# Patient Record
Sex: Female | Born: 1965 | Race: White | Hispanic: No | Marital: Single | Smoking: Current every day smoker
Health system: Southern US, Community
[De-identification: ages and names within clinical notes are randomized; demographics above are authoritative.]

## PROBLEM LIST (undated history)

## (undated) HISTORY — PX: TUBAL LIGATION: SHX77

---

## 2006-01-24 ENCOUNTER — Emergency Department (HOSPITAL_COMMUNITY): Admission: EM | Admit: 2006-01-24 | Discharge: 2006-01-24 | Payer: Self-pay | Admitting: Emergency Medicine

## 2009-04-17 ENCOUNTER — Encounter: Admission: RE | Admit: 2009-04-17 | Discharge: 2009-04-17 | Payer: Self-pay | Admitting: Neurosurgery

## 2009-05-05 ENCOUNTER — Encounter: Admission: RE | Admit: 2009-05-05 | Discharge: 2009-08-03 | Payer: Self-pay | Admitting: Neurosurgery

## 2010-01-23 ENCOUNTER — Encounter: Admission: RE | Admit: 2010-01-23 | Discharge: 2010-01-23 | Payer: Self-pay | Admitting: Neurosurgery

## 2013-09-12 ENCOUNTER — Emergency Department (HOSPITAL_COMMUNITY)
Admission: EM | Admit: 2013-09-12 | Discharge: 2013-09-12 | Disposition: A | Discharge status: Home / Self Care | Payer: Self-pay | Attending: Emergency Medicine | Admitting: Emergency Medicine

## 2013-09-12 ENCOUNTER — Encounter (HOSPITAL_COMMUNITY): Payer: Self-pay | Admitting: Emergency Medicine

## 2013-09-12 DIAGNOSIS — G5630 Lesion of radial nerve, unspecified upper limb: Secondary | ICD-10-CM | POA: Insufficient documentation

## 2013-09-12 DIAGNOSIS — F17200 Nicotine dependence, unspecified, uncomplicated: Secondary | ICD-10-CM | POA: Insufficient documentation

## 2013-09-12 LAB — BASIC METABOLIC PANEL
BUN: 7 mg/dL (ref 6–23)
CO2: 28 meq/L (ref 19–32)
CREATININE: 0.77 mg/dL (ref 0.50–1.10)
Calcium: 9.4 mg/dL (ref 8.4–10.5)
Chloride: 100 mEq/L (ref 96–112)
GFR calc Af Amer: >90 mL/min (ref >90)
GFR calc non Af Amer: >90 mL/min (ref >90)
GLUCOSE: 77 mg/dL (ref 70–99)
POTASSIUM: 4 meq/L (ref 3.7–5.3)
Sodium: 138 mEq/L (ref 137–147)

## 2013-09-12 LAB — CBC WITH DIFFERENTIAL/PLATELET
BASOS ABS: 0 10*3/uL (ref 0.0–0.1)
Basophils Relative: 0 % (ref 0–1)
Eosinophils Absolute: 0.2 10*3/uL (ref 0.0–0.7)
Eosinophils Relative: 3 % (ref 0–5)
HCT: 43.7 % (ref 36.0–46.0)
Hemoglobin: 15.2 g/dL — ABNORMAL HIGH (ref 12.0–15.0)
Lymphocytes Relative: 28 % (ref 12–46)
Lymphs Abs: 2.5 10*3/uL (ref 0.7–4.0)
MCH: 34.2 pg — ABNORMAL HIGH (ref 26.0–34.0)
MCHC: 34.8 g/dL (ref 30.0–36.0)
MCV: 98.4 fL (ref 78.0–100.0)
Monocytes Absolute: 0.4 10*3/uL (ref 0.1–1.0)
Monocytes Relative: 5 % (ref 3–12)
NEUTROS ABS: 5.6 10*3/uL (ref 1.7–7.7)
Neutrophils Relative %: 64 % (ref 43–77)
PLATELETS: 196 10*3/uL (ref 150–400)
RBC: 4.44 MIL/uL (ref 3.87–5.11)
RDW: 12.9 % (ref 11.5–15.5)
WBC: 8.7 10*3/uL (ref 4.0–10.5)

## 2013-09-12 MED ORDER — NAPROXEN 500 MG PO TABS: 500.0000 mg | ORAL_TABLET | Freq: Two times a day (BID) | ORAL | Status: DC

## 2013-09-12 MED ORDER — IBUPROFEN 800 MG PO TABS: 800.0000 mg | ORAL_TABLET | Freq: Three times a day (TID) | ORAL | Status: DC

## 2013-09-12 NOTE — Discharge Instructions (Signed)
Radial Nerve Palsy °Wrist drop is also known as radial nerve palsy. It is a condition in which you can not extend your wrist. This means if you are standing with your elbow bent at a right angle and with the top of your hand pointed at the ceiling, you can not hold your hand up. It falls toward the floor.  °This action of extending your wrist is caused by the muscles in the back of your arm. These muscles are controlled by the radial nerve. This means that anything affecting the radial nerve so it can not tell the muscles how to work will cause wrist drop. This is medically called radial nerve palsy. Also the radial nerve is a motor and sensory nerve so anything affecting it causes problems with movement and feeling. °CAUSES  °Some more common causes of wrist drop are: °· A break (fracture) of the large bone in the arm between your shoulder and your elbow (humerus). This is because the radial nerve winds around the humerus. °· Improper use of crutches causes this because the radial nerve runs through the armpit (axilla). Crutches which are too long can put pressure on the nerve. This is sometimes called crutch palsy. °· Falling asleep with your arm over a chair and supported on the back is a common cause. This is sometimes called Saturday Night Syndrome. °· Wrist drop can be associated with lead poisoning because of the effect of lead on the radial nerve. °SYMPTOMS  °The wrist drop is an obvious problem, but there may also be numbness in the back of the arm, forearm or hand which provides feeling in these areas by the radial nerve. There can be difficulty straightening out the elbow in addition to the wrist. There may be numbness, tingling, pain, burning sensations or other abnormal feelings. Symptoms depend entirely on where the radial nerve is injured. °DIAGNOSIS  °· Wrist drop is obvious just by looking at it. Your caregiver may make the diagnosis by taking your history and doing a couple tests. °· One test which  may be done is a nerve conduction study. This test shows if the radial nerve is conducting signals well. If not, it can determine where the nerve problem is. °· Sometimes X-ray studies are done. Your caregiver will determine if further testing needs to be done. °TREATMENT  °· Usually if the problem is found to be pressure on the nerve, simply removing the pressure will allow the nerve to go back to normal in a few weeks to a few months. Other treatments will depend upon the cause found. °· Only take over-the-counter or prescription medicines for pain, discomfort, or fever as directed by your caregiver. °· Sometimes seizure medications are used. °· Steroids are sometimes given to decrease swelling if it is thought to be a possible cause. °Document Released: 01/21/2006 Document Revised: 08/09/2011 Document Reviewed: 03/03/2006 °[employer]® Patient Information ©2014 [employer]. ° °

## 2013-09-12 NOTE — ED Provider Notes (Signed)
CSN: [id number]     Arrival date & time 09/12/13  1736 History  This chart was scribed for Rolland PorterMark Trenda Corliss, MD by Danella Maiersaroline Early, ED Scribe. This patient was seen in room APA15/APA15 and the patient's care was started at 8:54 PM.    Chief Complaint  Patient presents with  . Numbness    lt arm   The history is provided by the patient. No language interpreter was used.   HPI Comments: Michaela Morgan is a 48 y.o. female who presents to the Emergency Department complaining of constant, gradually-improving left arm numbness onset 3 nights ago after falling asleep in a chair. Pt states she is still having numbness and tingling in the entire forearm and she is unable to curl the fingers in her left hand. She states the chair she slept in had rails and she had her arms resting on the rails. She states she started wearing a brace yesterday and thinks it helped the numbness a little. States she developed throbbing pain in the forearm yesterday.    History reviewed. No pertinent past medical history. History reviewed. No pertinent past surgical history. History reviewed. No pertinent family history. History  Substance Use Topics  . Smoking status: Current Every Day Smoker -- 1.00 packs/day    Types: Cigarettes  . Smokeless tobacco: Not on file  . Alcohol Use: Yes   OB History   Grav Para Term Preterm Abortions TAB SAB Ect Mult Living                 Review of Systems  Constitutional: Negative for fever, chills, diaphoresis, appetite change and fatigue.  HENT: Negative for mouth sores, sore throat and trouble swallowing.   Eyes: Negative for visual disturbance.  Respiratory: Negative for cough, chest tightness, shortness of breath and wheezing.   Cardiovascular: Negative for chest pain.  Gastrointestinal: Negative for nausea, vomiting, abdominal pain, diarrhea and abdominal distention.  Endocrine: Negative for polydipsia, polyphagia and polyuria.  Genitourinary: Negative for dysuria, frequency and  hematuria.  Musculoskeletal: Negative for gait problem.  Skin: Negative for color change, pallor and rash.  Neurological: Positive for numbness. Negative for dizziness, syncope, light-headedness and headaches.  Hematological: Does not bruise/bleed easily.  Psychiatric/Behavioral: Negative for behavioral problems and confusion.      Allergies  Review of patient's allergies indicates no known allergies.  Home Medications   Prior to Admission medications   Not on File   BP 143/74  Pulse 74  Temp(Src) 97.7 F (36.5 C) (Oral)  Resp 18  Ht 5\' 8"  (1.727 m)  Wt 138 lb (62.596 kg)  BMI 20.99 kg/m2  SpO2 100%  LMP 08/12/2013 Physical Exam  Constitutional: She is oriented to person, place, and time. She appears well-developed and well-nourished. No distress.  HENT:  Head: Normocephalic.  Eyes: Conjunctivae are normal. Pupils are equal, round, and reactive to light. No scleral icterus.  Neck: Normal range of motion. Neck supple. No thyromegaly present.  Cardiovascular: Normal rate and regular rhythm.  Exam reveals no gallop and no friction rub.   No murmur heard. Pulmonary/Chest: Effort normal and breath sounds normal. No respiratory distress. She has no wheezes. She has no rales.  Abdominal: Soft. Bowel sounds are normal. She exhibits no distension. There is no tenderness. There is no rebound.  Musculoskeletal: Normal range of motion.  Neurological: She is alert and oriented to person, place, and time.  Skin: Skin is warm and dry. No rash noted.  Psychiatric: She has a normal mood and  affect. Her behavior is normal.   exam her left upper shoulder shows normal pulses and capillary refill. She has normal ulnar and dinner function. She has radial nerve palsy. Some tenderness but no palpable hematoma or mass or obvious noted injury to slightly above the elbow. She has weakness to extension at the wrist and to all digits. Had falls into flexion.  ED Course  Procedures (including critical  care time) Medications - No data to display  DIAGNOSTIC STUDIES: Oxygen Saturation is 100% on RA, normal by my interpretation.    COORDINATION OF CARE: 9:12 PM- Discussed treatment plan with pt. Pt agrees to plan.    Labs Review Labs Reviewed  CBC WITH DIFFERENTIAL - Abnormal; Notable for the following:    Hemoglobin 15.2 (*)    MCH 34.2 (*)    All other components within normal limits  BASIC METABOLIC PANEL    Imaging Review No results found.   EKG Interpretation None      MDM   Final diagnoses:  Radial nerve palsy    Exam and history sig for radial nerve palsy. She's getting summary every symptoms. Placed in a Velcro splint told her new position. Inflammatory. Neurological followup.  I personally performed the services described in this documentation, which was scribed in my presence. The recorded information has been reviewed and is accurate.   Rolland PorterMark Kym Scannell, MD 09/12/13 2128

## 2013-09-12 NOTE — ED Notes (Signed)
Pt states she woke up with lt arm numb 2 days ago, states the hand is tingling and has no use of her lt hand.

## 2015-01-02 ENCOUNTER — Emergency Department (HOSPITAL_COMMUNITY): Payer: Self-pay

## 2015-01-02 ENCOUNTER — Encounter (HOSPITAL_COMMUNITY): Payer: Self-pay | Admitting: Emergency Medicine

## 2015-01-02 ENCOUNTER — Emergency Department (HOSPITAL_COMMUNITY)
Admission: EM | Admit: 2015-01-02 | Discharge: 2015-01-02 | Disposition: A | Discharge status: Home / Self Care | Payer: Self-pay | Attending: Emergency Medicine | Admitting: Emergency Medicine

## 2015-01-02 DIAGNOSIS — R200 Anesthesia of skin: Secondary | ICD-10-CM | POA: Insufficient documentation

## 2015-01-02 DIAGNOSIS — Z720 Tobacco use: Secondary | ICD-10-CM | POA: Insufficient documentation

## 2015-01-02 DIAGNOSIS — R0602 Shortness of breath: Secondary | ICD-10-CM | POA: Insufficient documentation

## 2015-01-02 DIAGNOSIS — M549 Dorsalgia, unspecified: Secondary | ICD-10-CM | POA: Insufficient documentation

## 2015-01-02 DIAGNOSIS — R110 Nausea: Secondary | ICD-10-CM | POA: Insufficient documentation

## 2015-01-02 DIAGNOSIS — M542 Cervicalgia: Secondary | ICD-10-CM | POA: Insufficient documentation

## 2015-01-02 DIAGNOSIS — R0789 Other chest pain: Secondary | ICD-10-CM | POA: Insufficient documentation

## 2015-01-02 DIAGNOSIS — R05 Cough: Secondary | ICD-10-CM | POA: Insufficient documentation

## 2015-01-02 LAB — BASIC METABOLIC PANEL
ANION GAP: 9 (ref 5–15)
BUN: 9 mg/dL (ref 6–20)
CALCIUM: 9 mg/dL (ref 8.9–10.3)
CO2: 23 mmol/L (ref 22–32)
Chloride: 105 mmol/L (ref 101–111)
Creatinine, Ser: 0.87 mg/dL (ref 0.44–1.00)
GFR calc Af Amer: >60 mL/min (ref >60)
GLUCOSE: 109 mg/dL — AB (ref 65–99)
Potassium: 4 mmol/L (ref 3.5–5.1)
Sodium: 137 mmol/L (ref 135–145)

## 2015-01-02 LAB — CBC
HCT: 44.3 % (ref 36.0–46.0)
Hemoglobin: 15.5 g/dL — ABNORMAL HIGH (ref 12.0–15.0)
MCH: 34.8 pg — ABNORMAL HIGH (ref 26.0–34.0)
MCHC: 35 g/dL (ref 30.0–36.0)
MCV: 99.6 fL (ref 78.0–100.0)
Platelets: 172 10*3/uL (ref 150–400)
RBC: 4.45 MIL/uL (ref 3.87–5.11)
RDW: 12.5 % (ref 11.5–15.5)
WBC: 6.7 10*3/uL (ref 4.0–10.5)

## 2015-01-02 LAB — D-DIMER, QUANTITATIVE: D-Dimer, Quant: <0.27 ug/mL-FEU (ref 0.00–0.48)

## 2015-01-02 LAB — TROPONIN I

## 2015-01-02 MED ORDER — HYDROCODONE-ACETAMINOPHEN 5-325 MG PO TABS: 1.0000 | ORAL_TABLET | Freq: Four times a day (QID) | ORAL | Status: AC | PRN

## 2015-01-02 MED ORDER — NAPROXEN 500 MG PO TABS: 500.0000 mg | ORAL_TABLET | Freq: Two times a day (BID) | ORAL | Status: AC

## 2015-01-02 NOTE — Discharge Instructions (Signed)
Chest Wall Pain °Chest wall pain is pain felt in or around the chest bones and muscles. It may take up to 6 weeks to get better. It may take longer if you are active. Chest wall pain can happen on its own. Other times, things like germs, injury, coughing, or exercise can cause the pain. °HOME CARE  °· Avoid activities that make you tired or cause pain. Try not to use your chest, belly (abdominal), or side muscles. Do not use heavy weights. °· Put ice on the sore area. °¨ Put ice in a plastic bag. °¨ Place a towel between your skin and the bag. °¨ Leave the ice on for 15-20 minutes for the first 2 days. °· Only take medicine as told by your doctor. °GET HELP RIGHT AWAY IF:  °· You have more pain or are very uncomfortable. °· You have a fever. °· Your chest pain gets worse. °· You have new problems. °· You feel sick to your stomach (nauseous) or throw up (vomit). °· You start to sweat or feel lightheaded. °· You have a cough with mucus (phlegm). °· You cough up blood. °MAKE SURE YOU:  °· Understand these instructions. °· Will watch your condition. °· Will get help right away if you are not doing well or get worse. °Document Released: 11/03/2007 Document Revised: 08/09/2011 Document Reviewed: 01/11/2011 °[employer]® Patient Information ©2015 [employer]. This information is not intended to replace advice given to you by your health care provider. Make sure you discuss any questions you have with your health care provider. ° °

## 2015-01-02 NOTE — ED Provider Notes (Signed)
CSN: [id number]     Arrival date & time 01/02/15  0829 History  This chart was scribed for Vanetta Mulders, MD by Andrew Au, ED Scribe. This patient was seen in room APA14/APA14 and the patient's care was started at 8:44 AM.   Chief Complaint  Patient presents with  . Chest Pain   The history is provided by the patient. No language interpreter was used.   HPI Comments:  Michaela Morgan is a 49 y.o. female who present to the Emergency Department complaining of gradually worsening, radiating, 10/10 constant left sternum chest pain for the last month. She states chest pain would initially radiate to left back but within last couple weeks pain radiates to left shoulder and up to left neck.  Reports worsening chest pain with certain movement but finds relief with positioning her left arm against her chest but has occasional numbness to left arm and left hand when doing this. She's noticed associated slight SOB and nausea, no emesis.She has a cough due to sinus. She denies treating pain and associated symptoms.  She denies fever, emesis, visual changes, rhinorrhea, diarrhea, dysuria, leg swelling, rash, bleeding easily and HA.   History reviewed. No pertinent past medical history. Past Surgical History  Procedure Laterality Date  . Tubal ligation     No family history on file. History  Substance Use Topics  . Smoking status: Current Every Day Smoker -- 1.00 packs/day    Types: Cigarettes  . Smokeless tobacco: Not on file  . Alcohol Use: Yes   OB History    No data available     Review of Systems  Constitutional: Negative for fever and chills.  HENT: Negative for congestion, rhinorrhea and sore throat.   Eyes: Negative for visual disturbance.  Respiratory: Positive for cough and shortness of breath.   Cardiovascular: Positive for chest pain. Negative for leg swelling.  Gastrointestinal: Positive for nausea. Negative for vomiting, abdominal pain and diarrhea.  Genitourinary: Negative for  dysuria.  Musculoskeletal: Positive for back pain and neck pain.  Skin: Negative for rash.  Neurological: Positive for numbness. Negative for headaches.  Hematological: Does not bruise/bleed easily.  Psychiatric/Behavioral: Negative for confusion.   Allergies  Review of patient's allergies indicates no known allergies.  Home Medications   Prior to Admission medications   Medication Sig Start Date End Date Taking? Authorizing Provider  HYDROcodone-acetaminophen (NORCO/VICODIN) 5-325 MG per tablet Take 1-2 tablets by mouth every 6 (six) hours as needed. 01/02/15   Vanetta Mulders, MD  naproxen (NAPROSYN) 500 MG tablet Take 1 tablet (500 mg total) by mouth 2 (two) times daily. 01/02/15   Vanetta Mulders, MD   BP 124/75 mmHg  Pulse 62  Temp(Src) 97.9 F (36.6 C) (Oral)  Resp 18  Ht  (1.727 m)  Wt 138 lb (62.596 kg)  BMI 20.99 kg/m2  SpO2 100%  LMP 12/19/2014 Physical Exam  Constitutional: She is oriented to person, place, and time. She appears well-developed and well-nourished. No distress.  HENT:  Head: Normocephalic and atraumatic.  Mouth/Throat: Oropharynx is clear and moist.  Eyes: Conjunctivae and EOM are normal. Pupils are equal, round, and reactive to light. No scleral icterus.  Neck: Neck supple.  Cardiovascular: Normal rate and regular rhythm.   No murmur heard. tender to press left side of chest.  Pulmonary/Chest: Effort normal and breath sounds normal. She has no wheezes. She has no rales.  Abdominal: Soft. There is no tenderness.  Musculoskeletal: Normal range of motion.  No swelling in  ankles bilaterally.  Neurological: She is alert and oriented to person, place, and time. No cranial nerve deficit. She exhibits normal muscle tone. Coordination normal.  Skin: Skin is warm and dry. No rash noted.  Psychiatric: She has a normal mood and affect. Her behavior is normal.  Nursing note and vitals reviewed.  ED Course  Procedures (including critical care  time) DIAGNOSTIC STUDIES: Oxygen Saturation is 100% on RA, normal by my interpretation.    COORDINATION OF CARE: 9:03 AM- Pt advised of plan for treatment and pt agrees.  Labs Review Labs Reviewed  BASIC METABOLIC PANEL - Abnormal; Notable for the following:    Glucose, Bld 109 (*)    All other components within normal limits  CBC - Abnormal; Notable for the following:    Hemoglobin 15.5 (*)    MCH 34.8 (*)    All other components within normal limits  TROPONIN I  D-DIMER, QUANTITATIVE (NOT AT [facility])   Results for orders placed or performed during the hospital encounter of 01/02/15  Basic metabolic panel  Result Value Ref Range   Sodium 137 135 - 145 mmol/L   Potassium 4.0 3.5 - 5.1 mmol/L   Chloride 105 101 - 111 mmol/L   CO2 23 22 - 32 mmol/L   Glucose, Bld 109 (H) 65 - 99 mg/dL   BUN 9 6 - 20 mg/dL   Creatinine, Ser 8.65 0.44 - 1.00 mg/dL   Calcium 9.0 8.9 - 78.4 mg/dL   GFR calc non Af Amer >60 >60 mL/min   GFR calc Af Amer >60 >60 mL/min   Anion gap 9 5 - 15  CBC  Result Value Ref Range   WBC 6.7 4.0 - 10.5 K/uL   RBC 4.45 3.87 - 5.11 MIL/uL   Hemoglobin 15.5 (H) 12.0 - 15.0 g/dL   HCT 69.6 29.5 - 28.4 %   MCV 99.6 78.0 - 100.0 fL   MCH 34.8 (H) 26.0 - 34.0 pg   MCHC 35.0 30.0 - 36.0 g/dL   RDW 13.2 44.0 - 10.2 %   Platelets 172 150 - 400 K/uL  Troponin I  Result Value Ref Range   Troponin I <0.03 <0.031 ng/mL  D-dimer, quantitative (not at [facility]  Result Value Ref Range   D-Dimer, Quant <0.27 0.00 - 0.48 ug/mL-FEU     Imaging Review Dg Chest 2 View  01/02/2015   CLINICAL DATA:  Back pain, shortness of breath, nausea  EXAM: CHEST  2 VIEW  COMPARISON:  01/24/2006  FINDINGS: Cardiomediastinal silhouette is stable. No acute infiltrate or pleural effusion. No pulmonary edema. Bony thorax is unremarkable.  IMPRESSION: No active cardiopulmonary disease.   Electronically Signed   By: Natasha Mead M.D.   On: 01/02/2015 09:21     EKG Interpretation   Date/Time:   Thursday January 02 2015 08:39:19 EDT Ventricular Rate:  57 PR Interval:  125 QRS Duration: 111 QT Interval:  426 QTC Calculation: 415 R Axis:   108 Text Interpretation:  Sinus rhythm Consider right ventricular hypertrophy  No previous ECGs available Confirmed by Jolicia Delira  MD, Edilia Ghuman ([id number]) on  01/02/2015 8:42:55 AM      MDM   Final diagnoses:  Acute chest wall pain    Patient symptoms seem to be chest wall in nature reproducible tenderness to palpation left-sided chest. Some increased pain with certain movements of the left arm. Also does seem to get a nerve pinching with certain positions of the left arm gets numbness in the hands. Workup for  acute cardiac event was negative no evidence of pulmonary embolus based on pulse ox her d-dimer being negative. EKG without acute changes. Chest x-ray negative for pneumonia pneumothorax or pulmonary edema. Will treat as a muscle skeletal chest wall pain and also give referral to neurology for the pinched nerve.  I personally performed the services described in this documentation, which was scribed in my presence. The recorded information has been reviewed and is accurate.     Vanetta Mulders, MD 01/02/15 1041

## 2015-01-02 NOTE — ED Notes (Signed)
Pt states constant chest pain over the last month, getting worse, radiating into back

## 2016-07-12 IMAGING — DX DG CHEST 2V
2 series · 2 of 2 positions shown · non-contrast
Comparison: 01/24/2006

CLINICAL DATA: Back pain, shortness of breath, nausea

EXAM:
CHEST  2 VIEW

[chest pa]
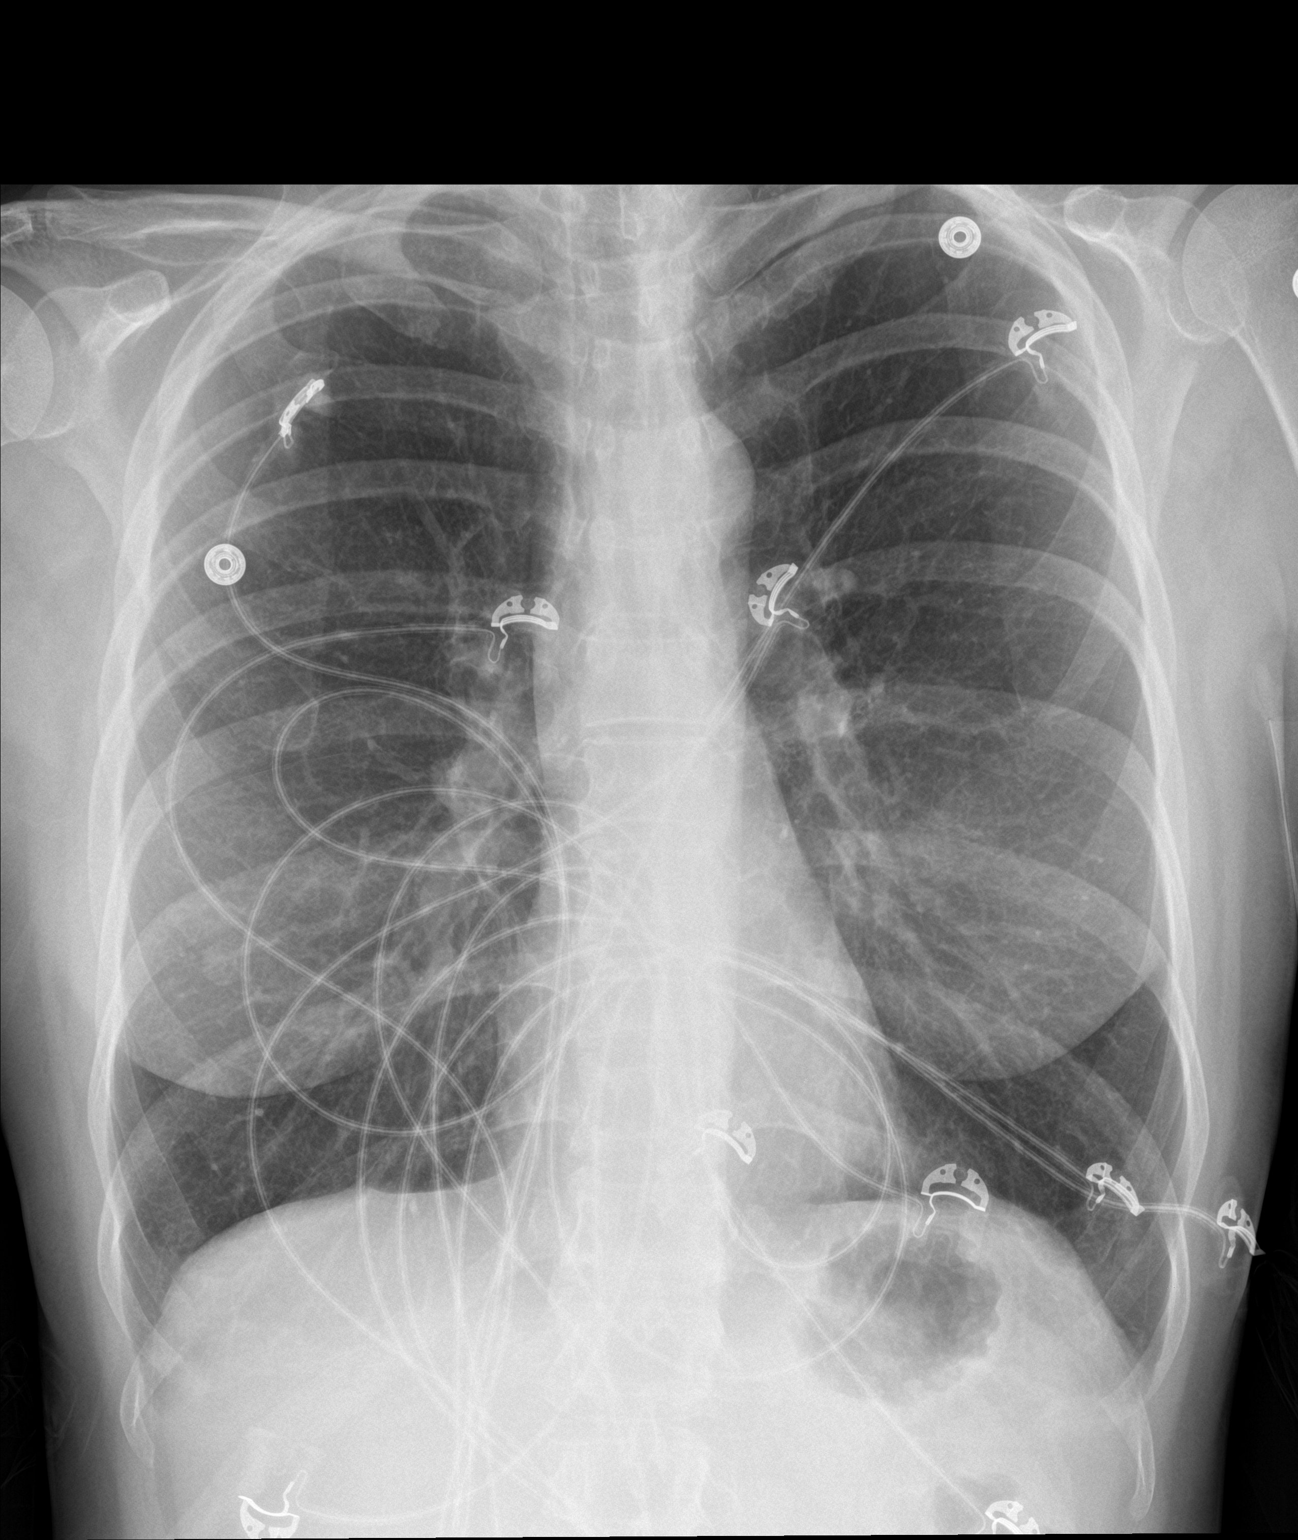

[chest lat]
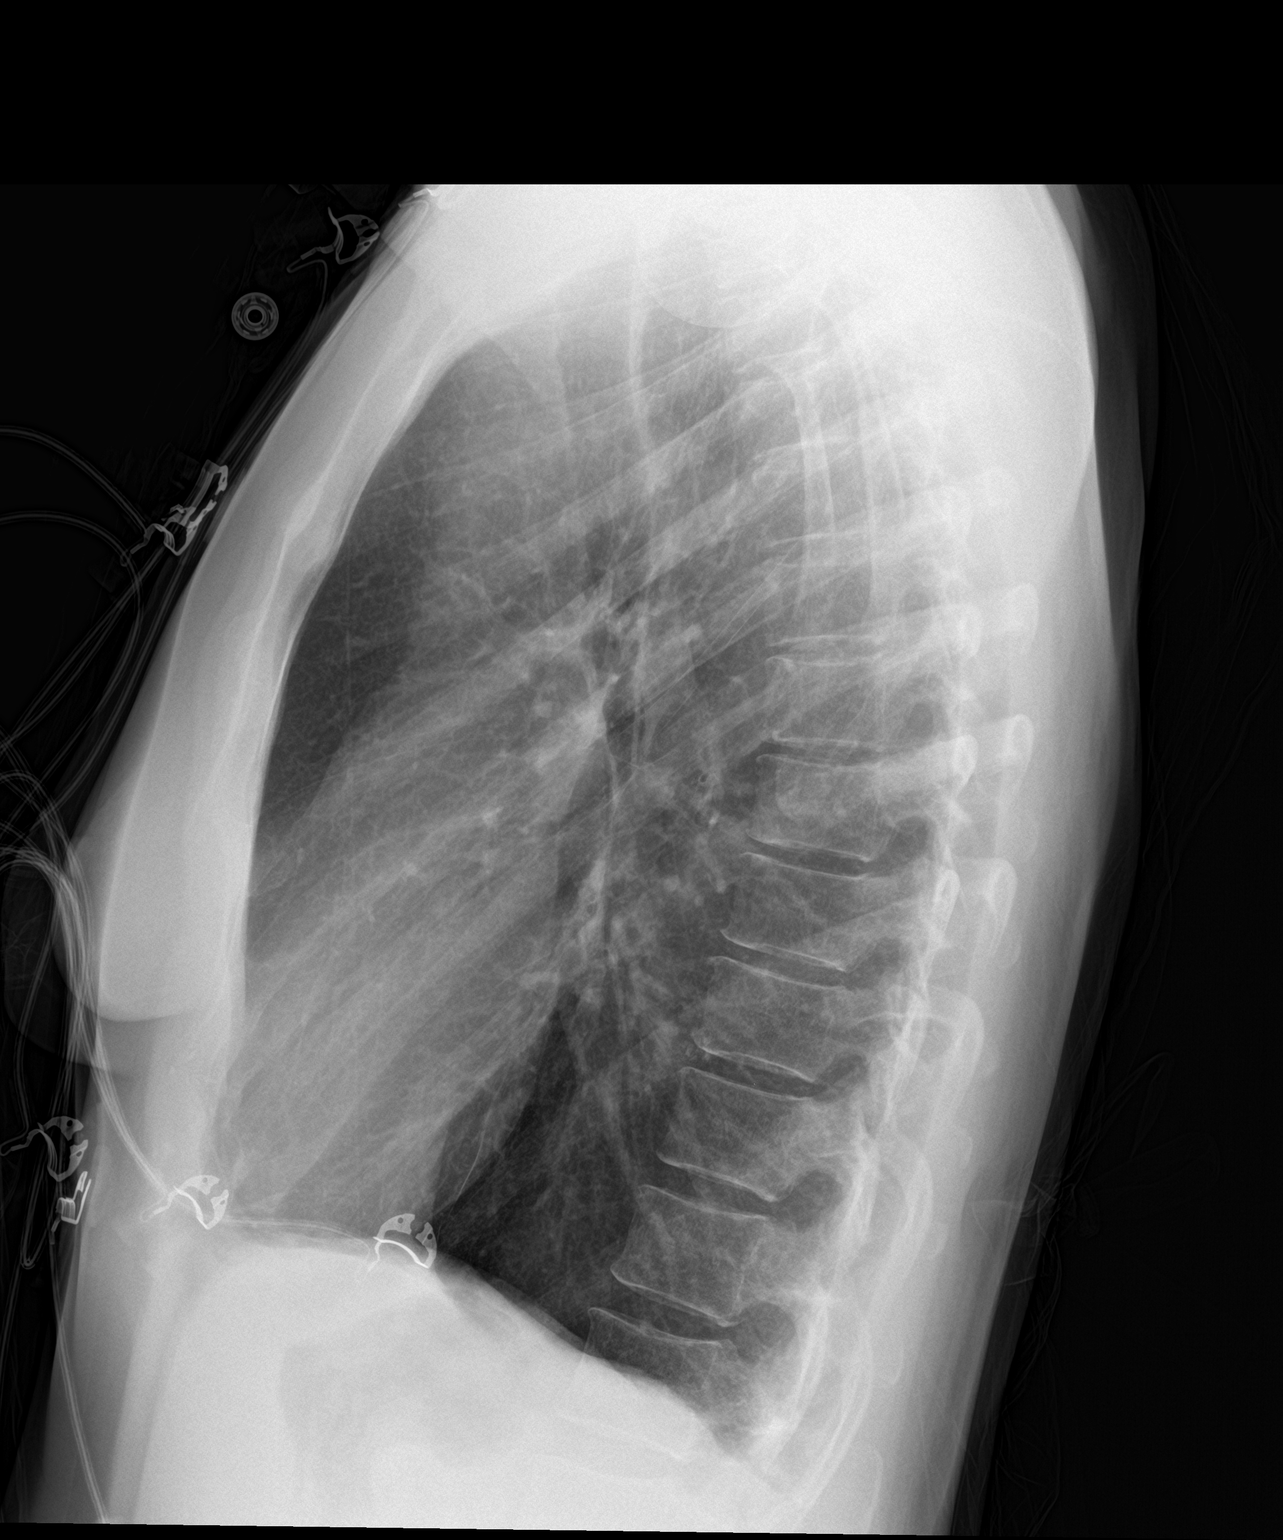

[2 of 2 positions shown; findings below may reference images not displayed]

FINDINGS: Cardiomediastinal silhouette is stable. No acute infiltrate or
pleural effusion. No pulmonary edema. Bony thorax is unremarkable.
IMPRESSION: No active cardiopulmonary disease.
